# Patient Record
Sex: Female | Born: 2000 | Race: White | Hispanic: Yes | Marital: Single | Smoking: Never smoker
Health system: Southern US, Community
[De-identification: ages and names within clinical notes are randomized; demographics above are authoritative.]

## PROBLEM LIST (undated history)

## (undated) DIAGNOSIS — Z789 Other specified health status: Secondary | ICD-10-CM

## (undated) HISTORY — DX: Other specified health status: Z78.9

## (undated) HISTORY — PX: OTHER SURGICAL HISTORY: SHX169

---
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
## 2023-09-23 NOTE — Progress Notes (Unsigned)
    Pediatrics, [facility]   No chief complaint on file.   HPI:      Ms. Michele Duran is a 22 y.o. No obstetric history on file. whose LMP was No LMP recorded., presents today for NP eval       Patient Active Problem List   Diagnosis Date Noted   Abdominal pain 02/01/2018   Foreign body in foot 04/07/2014    No past surgical history on file.  No family history on file.  Social History   Socioeconomic History   Marital status: Single    Spouse name: Not on file   Number of children: Not on file   Years of education: Not on file   Highest education level: Not on file  Occupational History   Not on file  Tobacco Use   Smoking status: Never   Smokeless tobacco: Never  Substance and Sexual Activity   Alcohol use: Not on file   Drug use: Not on file   Sexual activity: Not on file  Other Topics Concern   Not on file  Social History Narrative   Not on file   Social Determinants of Health   Financial Resource Strain: Not on file  Food Insecurity: Not on file  Transportation Needs: Not on file  Physical Activity: Not on file  Stress: Not on file  Social Connections: Not on file  Intimate Partner Violence: Not on file    Outpatient Medications Prior to Visit  Medication Sig Dispense Refill   doxycycline (VIBRA-TABS) 100 MG tablet Take 1 tablet (100 mg total) by mouth 2 (two) times daily. 14 tablet 0   ondansetron (ZOFRAN ODT) 4 MG disintegrating tablet Take 1 tablet (4 mg total) by mouth every 6 (six) hours as needed for nausea or vomiting. 20 tablet 0   oxyCODONE-acetaminophen (PERCOCET) 5-325 MG tablet Take 1 tablet by mouth every 6 (six) hours as needed for severe pain. 30 tablet 0   ranitidine (ZANTAC) 75 MG/5ML syrup Take by mouth.     No facility-administered medications prior to visit.      ROS:  Review of Systems BREAST: No symptoms   OBJECTIVE:   Vitals:  There were no vitals taken for this visit.  Physical Exam  Results: No  results found for this or any previous visit (from the past 24 hour(s)).   Assessment/Plan: No diagnosis found.    No orders of the defined types were placed in this encounter.     No follow-ups on file.  Vanilla Heatherington B. Jeydan Barner, PA-C 09/23/2023 2:57 PM

## 2023-09-24 ENCOUNTER — Encounter: Payer: Self-pay | Admitting: Obstetrics and Gynecology

## 2023-09-24 ENCOUNTER — Ambulatory Visit (INDEPENDENT_AMBULATORY_CARE_PROVIDER_SITE_OTHER): Payer: Medicaid Other | Admitting: Obstetrics and Gynecology

## 2023-09-24 VITALS — BP 100/70 | Ht 65.0 in | Wt 185.0 lb

## 2023-09-24 DIAGNOSIS — Z113 Encounter for screening for infections with a predominantly sexual mode of transmission: Secondary | ICD-10-CM

## 2023-09-24 DIAGNOSIS — N914 Secondary oligomenorrhea: Secondary | ICD-10-CM

## 2023-09-24 NOTE — Patient Instructions (Signed)
I value your feedback and you entrusting us with your care. If you get a [facility] patient survey, I would appreciate you taking the time to let us know about your experience today. Thank you! ? ? ?

## 2023-10-01 LAB — ESTRADIOL: Estradiol: 130 pg/mL

## 2023-10-01 LAB — FSH/LH
FSH: 2.4 m[IU]/mL
LH: 8.4 m[IU]/mL

## 2023-10-01 LAB — HEMOGLOBIN A1C
Est. average glucose Bld gHb Est-mCnc: 120 mg/dL
Hgb A1c MFr Bld: 5.8 % — ABNORMAL HIGH (ref 4.8–5.6)

## 2023-10-01 LAB — ANDROSTENEDIONE: Androstenedione LCMS: 150 ng/dL (ref 41–262)

## 2023-10-01 LAB — TESTOSTERONE,FREE AND TOTAL
Testosterone, Free: 2.1 pg/mL (ref 0.0–4.2)
Testosterone: 38 ng/dL (ref 13–71)

## 2023-10-01 LAB — DHEA-SULFATE: DHEA-SO4: 471 ug/dL — ABNORMAL HIGH (ref 110.0–431.7)

## 2023-10-01 LAB — PROGESTERONE: Progesterone: 7.3 ng/mL

## 2023-10-01 LAB — TSH: TSH: 1.87 u[IU]/mL (ref 0.450–4.500)

## 2023-10-01 LAB — PROLACTIN: Prolactin: 41 ng/mL — ABNORMAL HIGH (ref 4.8–33.4)

## 2024-12-07 ENCOUNTER — Ambulatory Visit: Payer: Self-pay

## 2024-12-07 DIAGNOSIS — Z23 Encounter for immunization: Secondary | ICD-10-CM

## 2024-12-07 DIAGNOSIS — Z719 Counseling, unspecified: Secondary | ICD-10-CM

## 2024-12-07 NOTE — Progress Notes (Signed)
 Patient seen in nurse clinic for immunizations. Flu and Tdap given and tolerated well. VIS provided. [facility] updated and 2 copies provided. Discussed return dates for vaccines.
